# Patient Record
Sex: Male | Born: 1980 | Race: White | Hispanic: No | Marital: Single | Smoking: Current every day smoker
Health system: Southern US, Community
[De-identification: ages and names within clinical notes are randomized; demographics above are authoritative.]

## PROBLEM LIST (undated history)

## (undated) DIAGNOSIS — F419 Anxiety disorder, unspecified: Secondary | ICD-10-CM

---

## 2005-10-13 ENCOUNTER — Emergency Department: Payer: Self-pay | Admitting: Emergency Medicine

## 2006-09-18 ENCOUNTER — Observation Stay: Payer: Self-pay | Admitting: General Surgery

## 2007-04-17 ENCOUNTER — Emergency Department: Payer: Self-pay | Admitting: Emergency Medicine

## 2007-04-17 ENCOUNTER — Other Ambulatory Visit: Payer: Self-pay

## 2007-05-05 ENCOUNTER — Other Ambulatory Visit: Payer: Self-pay

## 2007-05-05 ENCOUNTER — Emergency Department: Payer: Self-pay | Admitting: Emergency Medicine

## 2007-08-03 ENCOUNTER — Emergency Department: Payer: Self-pay | Admitting: Unknown Physician Specialty

## 2007-08-03 ENCOUNTER — Other Ambulatory Visit: Payer: Self-pay

## 2008-05-29 IMAGING — CR DG CHEST 2V
1 series · 2 of 2 positions shown · non-contrast
Comparison: none

REASON FOR EXAM: CP
COMMENTS:

[Series 1: view not recorded · 0.17mm/px · 2 of 2 slices shown]
[im 1/2]
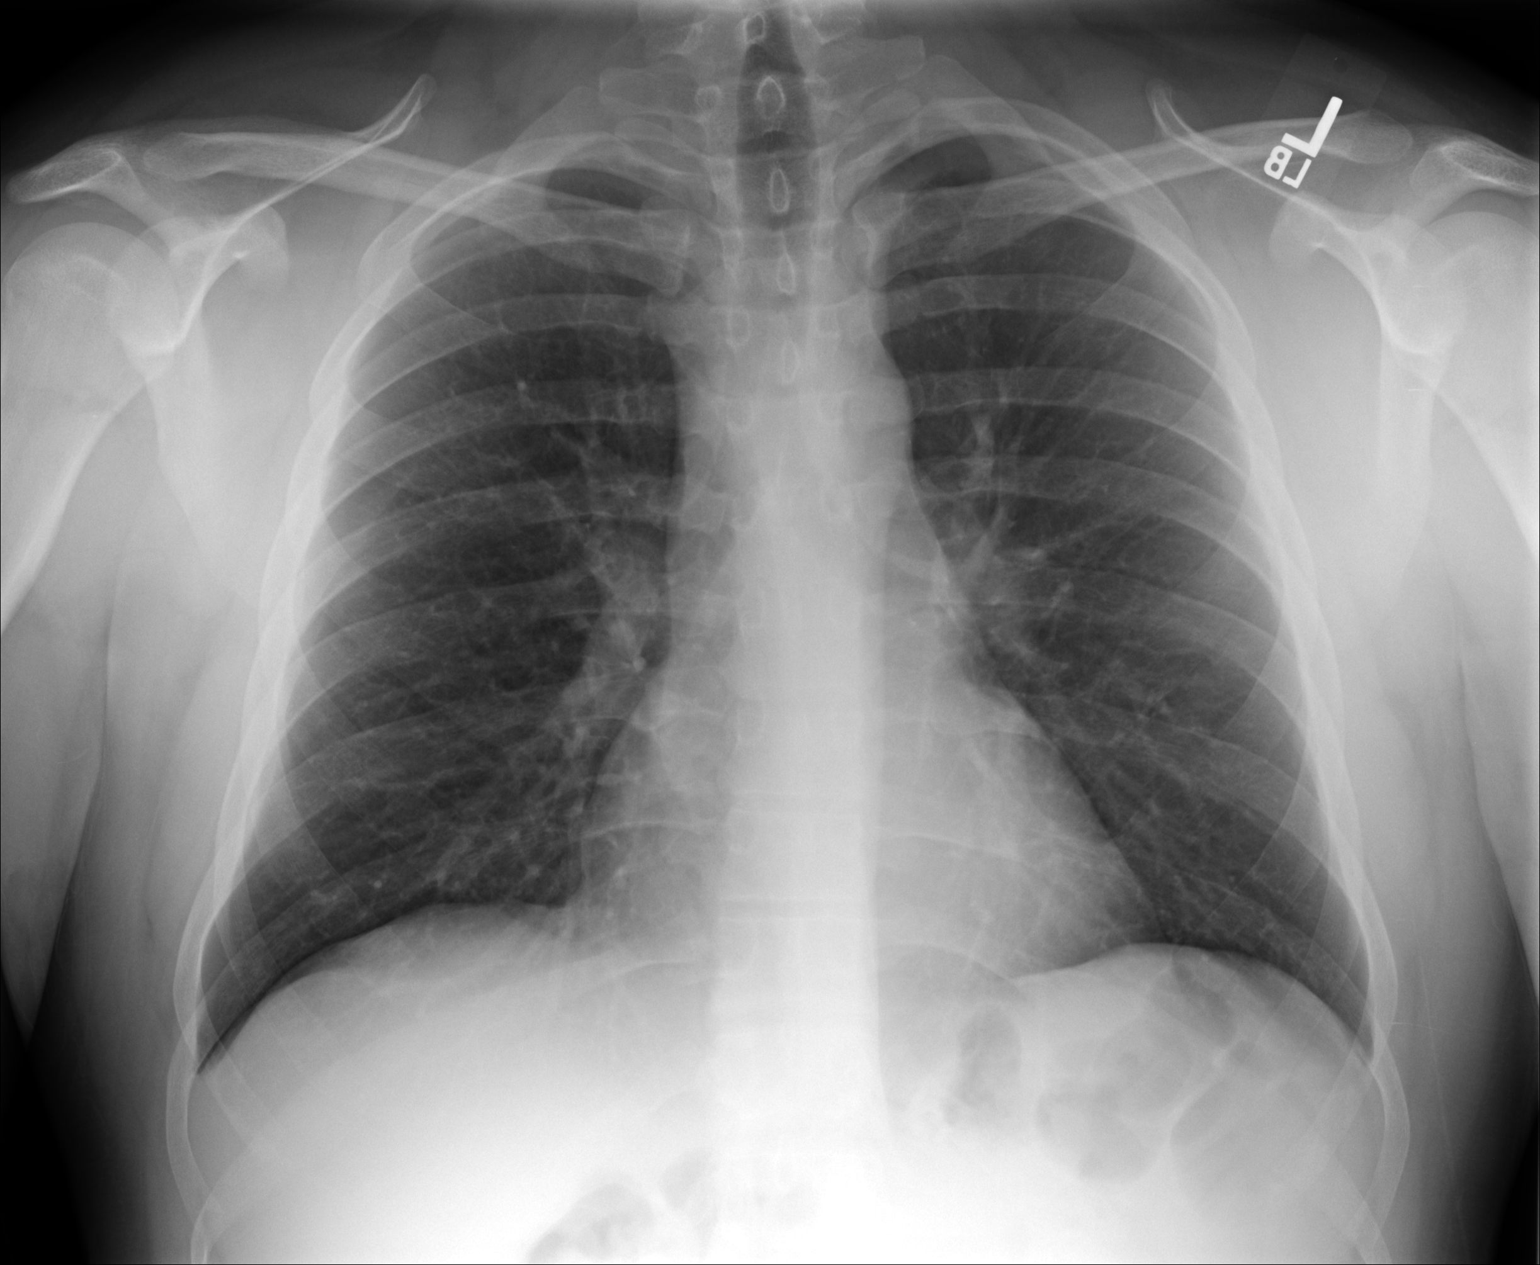
[im 2/2]
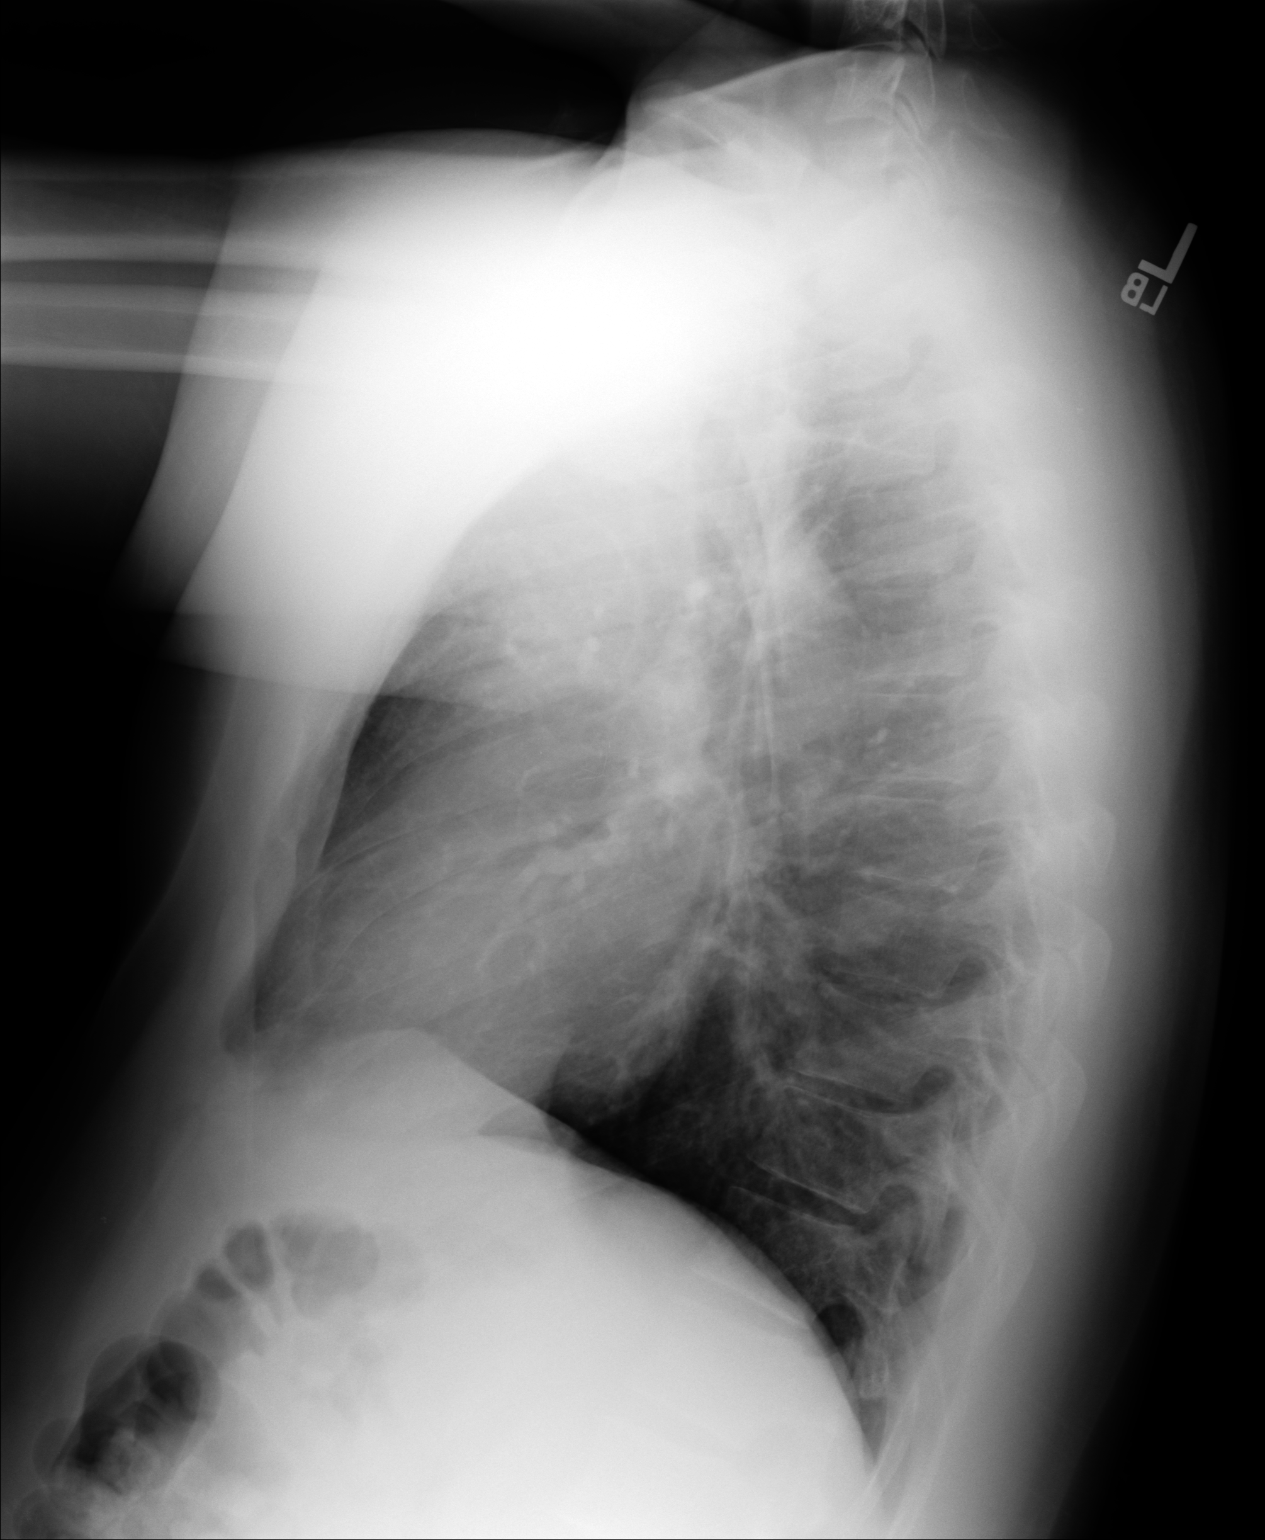

[2 of 2 positions shown; findings below may reference images not displayed]

PROCEDURE:     DXR - DXR CHEST PA (OR AP) AND LATERAL  - April 17, 2007  [DATE]

RESULT:     There is no prior study for comparison. The lungs are mildly
hyperinflated. The lungs are clear. The heart and pulmonary vessels are
normal. The bony and mediastinal structures are unremarkable. There is no
effusion. There is no pneumothorax or evidence of congestive failure.
IMPRESSION: No acute cardiopulmonary disease. There is hyperinflation
which suggests COPD or reactive airway disease. Physiologic hyperinflation
for the exam could give a similar appearance.

## 2015-03-01 ENCOUNTER — Encounter: Payer: Self-pay | Admitting: Emergency Medicine

## 2015-03-01 ENCOUNTER — Emergency Department
Admission: EM | Admit: 2015-03-01 | Discharge: 2015-03-01 | Disposition: A | Discharge status: Home / Self Care | Payer: Self-pay | Attending: Emergency Medicine | Admitting: Emergency Medicine

## 2015-03-01 DIAGNOSIS — E860 Dehydration: Secondary | ICD-10-CM | POA: Insufficient documentation

## 2015-03-01 DIAGNOSIS — Z720 Tobacco use: Secondary | ICD-10-CM | POA: Insufficient documentation

## 2015-03-01 DIAGNOSIS — Z79899 Other long term (current) drug therapy: Secondary | ICD-10-CM | POA: Insufficient documentation

## 2015-03-01 HISTORY — DX: Anxiety disorder, unspecified: F41.9

## 2015-03-01 LAB — CBC
HCT: 47.4 % (ref 40.0–52.0)
Hemoglobin: 16.6 g/dL (ref 13.0–18.0)
MCH: 30.7 pg (ref 26.0–34.0)
MCHC: 35 g/dL (ref 32.0–36.0)
MCV: 87.8 fL (ref 80.0–100.0)
PLATELETS: 172 10*3/uL (ref 150–440)
RBC: 5.4 MIL/uL (ref 4.40–5.90)
RDW: 13.7 % (ref 11.5–14.5)
WBC: 9.9 10*3/uL (ref 3.8–10.6)

## 2015-03-01 LAB — BASIC METABOLIC PANEL
Anion gap: 7 (ref 5–15)
BUN: 16 mg/dL (ref 6–20)
CALCIUM: 9.2 mg/dL (ref 8.9–10.3)
CHLORIDE: 104 mmol/L (ref 101–111)
CO2: 23 mmol/L (ref 22–32)
Creatinine, Ser: 0.98 mg/dL (ref 0.61–1.24)
GFR calc Af Amer: >60 mL/min (ref >60)
GFR calc non Af Amer: >60 mL/min (ref >60)
Glucose, Bld: 114 mg/dL — ABNORMAL HIGH (ref 65–99)
Potassium: 3.8 mmol/L (ref 3.5–5.1)
SODIUM: 134 mmol/L — AB (ref 135–145)

## 2015-03-01 NOTE — ED Notes (Signed)
Patient to ED with c/o while at work became dizzy and feeling like he was going to pass out, reports feeling the same on the way here but now feels some better. Reports symptoms occurred about an hour ago.

## 2015-03-01 NOTE — Discharge Instructions (Signed)
Dehydration, Adult °Dehydration is when you lose more fluids from the body than you take in. Vital organs like the kidneys, brain, and heart cannot function without a proper amount of fluids and salt. Any loss of fluids from the body can cause dehydration.  °CAUSES  °· Vomiting. °· Diarrhea. °· Excessive sweating. °· Excessive urine output. °· Fever. °SYMPTOMS  °Mild dehydration °· Thirst. °· Dry lips. °· Slightly dry mouth. °Moderate dehydration °· Very dry mouth. °· Sunken eyes. °· Skin does not bounce back quickly when lightly pinched and released. °· Dark urine and decreased urine production. °· Decreased tear production. °· Headache. °Severe dehydration °· Very dry mouth. °· Extreme thirst. °· Rapid, weak pulse (more than 100 beats per minute at rest). °· Cold hands and feet. °· Not able to sweat in spite of heat and temperature. °· Rapid breathing. °· Blue lips. °· Confusion and lethargy. °· Difficulty being awakened. °· Minimal urine production. °· No tears. °DIAGNOSIS  °Your caregiver will diagnose dehydration based on your symptoms and your exam. Blood and urine tests will help confirm the diagnosis. The diagnostic evaluation should also identify the cause of dehydration. °TREATMENT  °Treatment of mild or moderate dehydration can often be done at home by increasing the amount of fluids that you drink. It is best to drink small amounts of fluid more often. Drinking too much at one time can make vomiting worse. Refer to the home care instructions below. °Severe dehydration needs to be treated at the hospital where you will probably be given intravenous (IV) fluids that contain water and electrolytes. °HOME CARE INSTRUCTIONS  °· Ask your caregiver about specific rehydration instructions. °· Drink enough fluids to keep your urine clear or pale yellow. °· Drink small amounts frequently if you have nausea and vomiting. °· Eat as you normally do. °· Avoid: °¨ Foods or drinks high in sugar. °¨ Carbonated  drinks. °¨ Juice. °¨ Extremely hot or cold fluids. °¨ Drinks with caffeine. °¨ Fatty, greasy foods. °¨ Alcohol. °¨ Tobacco. °¨ Overeating. °¨ Gelatin desserts. °· Wash your hands well to avoid spreading bacteria and viruses. °· Only take over-the-counter or prescription medicines for pain, discomfort, or fever as directed by your caregiver. °· Ask your caregiver if you should continue all prescribed and over-the-counter medicines. °· Keep all follow-up appointments with your caregiver. °SEEK MEDICAL CARE IF: °· You have abdominal pain and it increases or stays in one area (localizes). °· You have a rash, stiff neck, or severe headache. °· You are irritable, sleepy, or difficult to awaken. °· You are weak, dizzy, or extremely thirsty. °SEEK IMMEDIATE MEDICAL CARE IF:  °· You are unable to keep fluids down or you get worse despite treatment. °· You have frequent episodes of vomiting or diarrhea. °· You have blood or green matter (bile) in your vomit. °· You have blood in your stool or your stool looks black and tarry. °· You have not urinated in 6 to 8 hours, or you have only urinated a small amount of very dark urine. °· You have a fever. °· You faint. °MAKE SURE YOU:  °· Understand these instructions. °· Will watch your condition. °· Will get help right away if you are not doing well or get worse. °Document Released: 06/11/2005 Document Revised: 09/03/2011 Document Reviewed: 01/29/2011 °[employer]® Patient Information ©2015 [employer]. This information is not intended to replace advice given to you by your health care provider. Make sure you discuss any questions you have with your health care   provider. ° °Near-Syncope °Near-syncope (commonly known as near fainting) is sudden weakness, dizziness, or feeling like you might pass out. During an episode of near-syncope, you may also develop pale skin, have tunnel vision, or feel sick to your stomach (nauseous). Near-syncope may occur when getting up after sitting or  while standing for a long time. It is caused by a sudden decrease in blood flow to the brain. This decrease can result from various causes or triggers, most of which are not serious. However, because near-syncope can sometimes be a sign of something serious, a medical evaluation is required. The specific cause is often not determined. °HOME CARE INSTRUCTIONS  °Monitor your condition for any changes. The following actions may help to alleviate any discomfort you are experiencing: °· Have someone stay with you until you feel stable. °· Lie down right away and prop your feet up if you start feeling like you might faint. Breathe deeply and steadily. Wait until all the symptoms have passed. Most of these episodes last only a few minutes. You may feel tired for several hours.   °· Drink enough fluids to keep your urine clear or pale yellow.   °· If you are taking blood pressure or heart medicine, get up slowly when seated or lying down. Take several minutes to sit and then stand. This can reduce dizziness. °· Follow up with your health care provider as directed.  °SEEK IMMEDIATE MEDICAL CARE IF:  °· You have a severe headache.   °· You have unusual pain in the chest, abdomen, or back.   °· You are bleeding from the mouth or rectum, or you have black or tarry stool.   °· You have an irregular or very fast heartbeat.   °· You have repeated fainting or have seizure-like jerking during an episode.   °· You faint when sitting or lying down.   °· You have confusion.   °· You have difficulty walking.   °· You have severe weakness.   °· You have vision problems.   °MAKE SURE YOU:  °· Understand these instructions. °· Will watch your condition. °· Will get help right away if you are not doing well or get worse. °Document Released: 06/11/2005 Document Revised: 06/16/2013 Document Reviewed: 11/14/2012 °[employer]® Patient Information ©2015 [employer]. This information is not intended to replace advice given to you by your health  care provider. Make sure you discuss any questions you have with your health care provider. ° °

## 2015-03-01 NOTE — ED Provider Notes (Signed)
[facility] Emergency Department Provider Note  ____________________________________________  Time seen: 1:05 PM  I have reviewed the triage vital signs and the nursing notes.   HISTORY  Chief Complaint Dizziness and Near Syncope    HPI Eric Arias is a 34 y.o. male who was at work today when he noticed that his vision became dark and he felt very lightheaded like he is going to pass out. He sat down right away and he started to feel better. 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 He otherwise denies any preceding symptoms such as chest pain shortness of breath abdominal pain nausea vomiting diarrhea diaphoresis. No headache or vision changes before hand. Syncope fall or trauma. Currently feeling back to normal while resting in the stretcher.     Past Medical History  Diagnosis Date  . Anxiety      There are no active problems to display for this patient.    History reviewed. No pertinent past surgical history.   Current Outpatient Rx  Name  Route  Sig  Dispense  Refill  . clonazePAM (KLONOPIN) 1 MG tablet   Oral   Take 1 mg by mouth 3 (three) times daily.            Allergies Review of patient's allergies indicates no known allergies.   History reviewed. No pertinent family history.  Social History Social History  Substance Use Topics  . Smoking status: Current Every Day Smoker  . Smokeless tobacco: None  . Alcohol Use: No    Review of Systems  Constitutional:   No fever or chills. No weight changes Eyes:   No blurry vision or double vision.  ENT:   No sore throat. Cardiovascular:   No chest pain. Respiratory:   No dyspnea or cough. Gastrointestinal:   Negative for abdominal pain, vomiting and diarrhea.  No BRBPR or melena. Genitourinary:   Negative for dysuria, urinary retention, bloody  urine, or difficulty urinating. Musculoskeletal:   Negative for back pain. No joint swelling or pain. Skin:   Negative for rash. Neurological:   Headache and lightheadedness, no paresthesia or weakness. Psychiatric:  No anxiety or depression.   Endocrine:  No hot/cold intolerance, changes in energy, or sleep difficulty.  10-point ROS otherwise negative.  ____________________________________________   PHYSICAL EXAM:  VITAL SIGNS: ED Triage Vitals  Enc Vitals Group     BP 03/01/15 1140 128/90 mmHg     Pulse Rate 03/01/15 1140 87     Resp 03/01/15 1140 20     Temp 03/01/15 1140 97.5 F (36.4 C)     Temp Source 03/01/15 1140 Oral     SpO2 03/01/15 1140 97 %     Weight 03/01/15 1140 198 lb (89.812 kg)     Height 03/01/15 1140  (1.702 m)     Head Cir --      Peak Flow --      Pain Score --      Pain Loc --      Pain Edu? --      Excl. in GC? --      Constitutional:   Alert and oriented. Well appearing and in no distress. Eyes:   No scleral icterus. No conjunctival pallor. PERRL. EOMI ENT   Head:   Normocephalic and atraumatic.   Nose:   No congestion/rhinnorhea. No septal hematoma  Mouth/Throat:   Dry mucous membranes, no pharyngeal erythema. No peritonsillar mass. No uvula shift.   Neck:   No stridor. No SubQ emphysema. No meningismus. Hematological/Lymphatic/Immunilogical:   No cervical lymphadenopathy. Cardiovascular:   RRR. Heart rate increases from 70-95 when repositioned from sitting to standing. Normal and symmetric distal pulses are present in all extremities. No murmurs, rubs, or gallops. Respiratory:   Normal respiratory effort without tachypnea nor retractions. Breath sounds are clear and equal bilaterally. No wheezes/rales/rhonchi. Gastrointestinal:   Soft and nontender. No distention. There is no CVA tenderness.  No rebound, rigidity, or guarding. Genitourinary:   deferred Musculoskeletal:   Nontender with normal range of motion in all  extremities. No joint effusions.  No lower extremity tenderness.  No edema. Neurologic:   Normal speech and language.  CN 2-10 normal. Motor grossly intact. No pronator drift.  Normal gait. No gross focal neurologic deficits are appreciated.  Skin:    Skin is warm, dry and intact. No rash noted.  No petechiae, purpura, or bullae. Psychiatric:   Mood and affect are normal. Speech and behavior are normal. Patient exhibits appropriate insight and judgment.  Patient has produced a urine sample which is in the room. On visual inspection it is very dark. ____________________________________________    LABS (pertinent positives/negatives) (all labs ordered are listed, but only abnormal results are displayed) Labs Reviewed  BASIC METABOLIC PANEL - Abnormal; Notable for the following:    Sodium 134 (*)    Glucose, Bld 114 (*)    All other components within normal limits  CBC  CBG MONITORING, ED   ____________________________________________   EKG  Interpreted by me Normal sinus rhythm rate of 88, left axis, normal intervals QRS and ST segments and T waves  ____________________________________________    RADIOLOGY    ____________________________________________   PROCEDURES   ____________________________________________   INITIAL IMPRESSION / ASSESSMENT AND PLAN / ED COURSE  Pertinent labs & imaging results that were available during my care of the patient were reviewed by me and considered in my medical decision making (see chart for details).  Patient presents with symptoms consistent with dehydration with risk factors including prolonged heat exposure and strenuous work in a hot environment. He also drinks a lot of sugary and caffeinated beverages. He has orthostatic increase in heart rate and dark urine which all is consistent with dehydration. I counseled the patient to increase his water intake as well as accompanying bat with intake of simple carbohydrates in salt  such as potato chips and pretzels. He is well-appearing no acute distress. Very low suspicion for ACS PE TAD pneumothorax carditis pneumonia sepsis AAA earlier pathology or perforation or other concerns.     ____________________________________________   FINAL CLINICAL IMPRESSION(S) / ED DIAGNOSES  Final diagnoses:  Dehydration      Sharman Cheek, MD 03/01/15 1349

## 2015-03-02 LAB — GLUCOSE, CAPILLARY: GLUCOSE-CAPILLARY: 94 mg/dL (ref 65–99)
